# Patient Record
Sex: Male | Born: 1995 | Race: White | Hispanic: No | Marital: Single | Smoking: Never smoker
Health system: Southern US, Community
[De-identification: ages and names within clinical notes are randomized; demographics above are authoritative.]

## PROBLEM LIST (undated history)

## (undated) DIAGNOSIS — K219 Gastro-esophageal reflux disease without esophagitis: Secondary | ICD-10-CM

## (undated) DIAGNOSIS — K589 Irritable bowel syndrome without diarrhea: Secondary | ICD-10-CM

## (undated) HISTORY — DX: Gastro-esophageal reflux disease without esophagitis: K21.9

## (undated) HISTORY — PX: KNEE SURGERY: SHX244

## (undated) HISTORY — PX: TONSILLECTOMY: SUR1361

## (undated) HISTORY — DX: Irritable bowel syndrome, unspecified: K58.9

---
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
## 2016-07-01 ENCOUNTER — Encounter: Payer: Self-pay | Admitting: Family Medicine

## 2016-07-01 ENCOUNTER — Ambulatory Visit (INDEPENDENT_AMBULATORY_CARE_PROVIDER_SITE_OTHER): Payer: BLUE CROSS/BLUE SHIELD | Admitting: Family Medicine

## 2016-07-01 VITALS — BP 131/77 | HR 90 | Temp 98.1°F | Resp 20 | Wt 187.0 lb

## 2016-07-01 DIAGNOSIS — R609 Edema, unspecified: Secondary | ICD-10-CM | POA: Diagnosis not present

## 2016-07-01 LAB — CBC WITH DIFFERENTIAL/PLATELET
BASOS ABS: 0 {cells}/uL (ref 0–200)
Basophils Relative: 0 %
EOS ABS: 0 {cells}/uL — AB (ref 15–500)
Eosinophils Relative: 0 %
HCT: 47 % (ref 38.5–50.0)
Hemoglobin: 16.3 g/dL (ref 13.2–17.1)
LYMPHS PCT: 27 %
Lymphs Abs: 2052 cells/uL (ref 850–3900)
MCH: 31.4 pg (ref 27.0–33.0)
MCHC: 34.7 g/dL (ref 32.0–36.0)
MCV: 90.6 fL (ref 80.0–100.0)
MONOS PCT: 11 %
MPV: 9.8 fL (ref 7.5–12.5)
Monocytes Absolute: 836 cells/uL (ref 200–950)
NEUTROS PCT: 62 %
Neutro Abs: 4712 cells/uL (ref 1500–7800)
PLATELETS: 208 10*3/uL (ref 140–400)
RBC: 5.19 MIL/uL (ref 4.20–5.80)
RDW: 12.7 % (ref 11.0–15.0)
WBC: 7.6 10*3/uL (ref 3.8–10.8)

## 2016-07-01 MED ORDER — PREDNISONE 20 MG PO TABS: 40.0000 mg | ORAL_TABLET | Freq: Every day | ORAL | 0 refills | Status: DC

## 2016-07-01 MED ORDER — AMOXICILLIN-POT CLAVULANATE 875-125 MG PO TABS: 1.0000 | ORAL_TABLET | Freq: Two times a day (BID) | ORAL | 0 refills | Status: DC

## 2016-07-01 NOTE — Patient Instructions (Addendum)
Suck on sour patch candies or lemon drops. The tartness can help release parotid swelling (if that is the cause).   Antibiotics prescribed to cover infection.  Steroids prescribed to cover inflammation.  If worsening would need to be seen immediately, want to see you Friday regardless.

## 2016-07-01 NOTE — Progress Notes (Signed)
Allen BaptistSeth Trovato , Jun 30, 1995, 21 y.o., male MRN: [id number] Patient Care Team    Relationship Specialty Notifications Start End  Waldon MerlWilliam C Martin, PA-C PCP - General Physician Assistant  07/21/13     CC: Left-sided facial swelling Subjective: Pt presents for an acute OV with complaints of left-sided facial swelling of 3 days duration.  Patient is present with his mother today, they state that he has never experienced anything like this in the past. He reports about 3 days ago he noticed his face started to swell on the left side, and has been worsening over the last 3 days. He has created some discomfort with attempting to open his jaw fully. He denies fever, chills, tooth pain, ear pain, sore throat, difficulty swallowing, break in skin/redness over area or drainage. He has tried taking ibuprofen and using ice packs over the area. Nothing seems to be improving the area. Patient denies any other area of swelling or rash over his body. He is tolerating food and drink. Mother states he has been fully vaccinated with all his childhood immunizations. No flowsheet data found.  No Known Allergies Social History  Substance Use Topics  . Smoking status: Never Smoker  . Smokeless tobacco: Never Used  . Alcohol use No   Past Medical History:  Diagnosis Date  . GERD (gastroesophageal reflux disease)   . IBS (irritable bowel syndrome)    Past Surgical History:  Procedure Laterality Date  . KNEE SURGERY Left   . TONSILLECTOMY    . WISDOM TOOTH EXTRACTION Bilateral 2014   Family History  Problem Relation Age of Onset  . Heart disease Maternal Grandfather   . Hyperlipidemia Maternal Grandfather   . Diabetes Maternal Grandfather   . Heart disease Paternal Grandfather   . Hyperlipidemia Paternal Grandfather   . Diabetes Paternal Grandfather    Allergies as of 07/01/2016   No Known Allergies     Medication List    as of 07/01/2016  3:00 PM   You have not been prescribed any medications.       No results found for this or any previous visit (from the past 24 hour(s)). No results found.   ROS: Negative, with the exception of above mentioned in HPI   Objective:  BP 131/77 (BP Location: Right Arm, Patient Position: Sitting, Cuff Size: Large)   Pulse 90   Temp 98.1 F (36.7 C)   Resp 20   Wt 187 lb (84.8 kg)   SpO2 100%  There is no height or weight on file to calculate BMI. Gen: Afebrile. No acute distress. Nontoxic in appearance, well developed, well nourished. Pleasant Caucasian male. HENT: AT. No erythema, skin intact. Moderate swelling throughout left mandible extending to left temple, Focused over parotid gland. Bilateral TM visualized without erythema or bulging. MMM, no oral lesions. No teeth pain. No gum erythema, abscess or drainage. No drainage noted from parotid duct. Bilateral nares without erythema, drainage or swelling. Throat without erythema or exudates. No cough. No hoarseness. Mildly poor dental hygiene. Eyes:Pupils Equal Round Reactive to light, Extraocular movements intact,  Conjunctiva without redness, discharge or icterus. Neck/lymp/endocrine: Supple, no head or neck lymphadenopathy CV: RRR  Chest: CTAB, no wheeze or crackles. Good air movement, normal resp effort.  Abd: Soft. NTND. BS present  Neuro:  Normal gait. PERLA. EOMi. Alert. Oriented x3   Assessment/Plan: Allen Villa is a 21 y.o. male present for acute OV for  1. Parotid swelling - Rather significant left parotid swelling, consistent with parotitis.  Patient is afebrile. Other than obvious swelling, patient does not feel sick. He is only in mild discomfort, mostly with jaw opening. He is eating and drinking well. Discussed with patient and his mother today there are multiple causes to parotid gland swelling, which is what his exam is most consistent. Obtain labs to rule out infection, EBV or mumps. He can use NSAIDs for discomfort. Covered with antibiotic and steroid burst. - Mumps Antibody,  IgM - Mumps antibody, IgG - CBC w/Diff - Epstein-Barr virus VCA, IgM - amoxicillin-clavulanate (AUGMENTIN) 875-125 MG tablet; Take 1 tablet by mouth 2 (two) times daily.  Dispense: 20 tablet; Refill: 0 - predniSONE (DELTASONE) 20 MG tablet; Take 2 tablets (40 mg total) by mouth daily with breakfast.  Dispense: 10 tablet; Refill: 0 - External resources on parotitis, and home therapies to help with symptoms included. - Patient will follow-up in 2 days, if worsening he is to be seen immediately. If not improving on Friday would consider referral versus CT.   Reviewed expectations re: course of current medical issues.  Discussed self-management of symptoms.  Outlined signs and symptoms indicating need for more acute intervention.  Patient verbalized understanding and all questions were answered.  Patient received an After-Visit Summary.   electronically signed by:  Felix Pacini, DO  [facility] [place]

## 2016-07-02 ENCOUNTER — Encounter: Payer: Self-pay | Admitting: Family Medicine

## 2016-07-02 LAB — MUMPS ANTIBODY, IGM

## 2016-07-02 LAB — MUMPS ANTIBODY, IGG: Mumps IgG: 18.5 AU/mL — ABNORMAL HIGH (ref <9.00)

## 2016-07-02 LAB — EPSTEIN-BARR VIRUS VCA, IGM: EBV VCA IgM: <36 U/mL

## 2016-07-03 ENCOUNTER — Ambulatory Visit (INDEPENDENT_AMBULATORY_CARE_PROVIDER_SITE_OTHER): Payer: BLUE CROSS/BLUE SHIELD | Admitting: Family Medicine

## 2016-07-03 ENCOUNTER — Encounter: Payer: Self-pay | Admitting: Family Medicine

## 2016-07-03 VITALS — BP 116/71 | HR 52 | Temp 98.0°F | Resp 20 | Ht 73.0 in | Wt 186.8 lb

## 2016-07-03 DIAGNOSIS — K1120 Sialoadenitis, unspecified: Secondary | ICD-10-CM | POA: Diagnosis not present

## 2016-07-03 NOTE — Progress Notes (Signed)
Allen Villa , July 01, 1995, 21 y.o., male MRN: [id number] Patient Care Team    Relationship Specialty Notifications Start End  Waldon MerlWilliam C Martin, PA-C PCP - General Physician Assistant  07/21/13     CC: Left-sided facial swelling Subjective:  Parotiditis:  Pt presents today for follow up on parotiditis. He is tolerating the abx and steroid regimen. He has noted great improvement in his condition and the swelling has improved  a great deal. He is using heat and sugar free sour candies as well. He denies any fever or increased pain. Reviewed labs with him today: normal CBC, Mump titers and EBV titers.     Prior note:  Pt presents for an acute OV with complaints of left-sided facial swelling of 3 days duration.  Patient is present with his mother today, they state that he has never experienced anything like this in the past. He reports about 3 days ago he noticed his face started to swell on the left side, and has been worsening over the last 3 days. He has created some discomfort with attempting to open his jaw fully. He denies fever, chills, tooth pain, ear pain, sore throat, difficulty swallowing, break in skin/redness over area or drainage. He has tried taking ibuprofen and using ice packs over the area. Nothing seems to be improving the area. Patient denies any other area of swelling or rash over his body. He is tolerating food and drink. Mother states he has been fully vaccinated with all his childhood immunizations. No flowsheet data found.  No Known Allergies Social History  Substance Use Topics  . Smoking status: Never Smoker  . Smokeless tobacco: Never Used  . Alcohol use No   Past Medical History:  Diagnosis Date  . GERD (gastroesophageal reflux disease)   . IBS (irritable bowel syndrome)    Past Surgical History:  Procedure Laterality Date  . KNEE SURGERY Left   . TONSILLECTOMY    . WISDOM TOOTH EXTRACTION Bilateral 2014   Family History  Problem Relation Age of Onset    . Heart disease Maternal Grandfather   . Hyperlipidemia Maternal Grandfather   . Diabetes Maternal Grandfather   . Heart disease Paternal Grandfather   . Hyperlipidemia Paternal Grandfather   . Diabetes Paternal Grandfather    Allergies as of 07/03/2016   No Known Allergies     Medication List       Accurate as of 07/03/16  9:00 AM. Always use your most recent med list.          amoxicillin-clavulanate 875-125 MG tablet Commonly known as:  AUGMENTIN Take 1 tablet by mouth 2 (two) times daily.   predniSONE 20 MG tablet Commonly known as:  DELTASONE Take 2 tablets (40 mg total) by mouth daily with breakfast.       No results found for this or any previous visit (from the past 24 hour(s)). No results found.   ROS: Negative, with the exception of above mentioned in HPI   Objective:  BP 116/71 (BP Location: Right Arm, Patient Position: Sitting, Cuff Size: Large)   Pulse (!) 52   Temp 98 F (36.7 C)   Resp 20   Ht 6\' 1"  (1.854 m)   Wt 186 lb 12.8 oz (84.7 kg)   SpO2 100%   BMI 24.65 kg/m  Body mass index is 24.65 kg/m. Gen: Afebrile. No acute distress.  HENT: AT. Much improved left parotid swelling. Mild residual swelling remains.  MMM. No drainage.  Eyes:Pupils Equal Round Reactive  to light, Extraocular movements intact,  Conjunctiva without redness, discharge or icterus. Neck/lymp/endocrine: Supple,small left pea sized mass posterior mandible. Non-tender.    Assessment/Plan: Allen Villa is a 21 y.o. male present for follow up OV for  Parotitis - all labs normal, reviewed with him today. Much improved condition today.  - continue regimen of abx, steroid.  - encouraged routine dental hygiene and visit.  - F/U PRN  Reviewed expectations re: course of current medical issues.  Discussed self-management of symptoms.  Outlined signs and symptoms indicating need for more acute intervention.  Patient verbalized understanding and all questions were  answered.  Patient received an After-Visit Summary.  electronically signed by:  Felix Pacini, DO  [facility] [place]

## 2016-07-03 NOTE — Patient Instructions (Signed)
Continue current regimen. Much improvement today!!!  Follow with routine dental care.

## 2016-08-18 ENCOUNTER — Ambulatory Visit (INDEPENDENT_AMBULATORY_CARE_PROVIDER_SITE_OTHER): Payer: BLUE CROSS/BLUE SHIELD | Admitting: Family Medicine

## 2016-08-18 ENCOUNTER — Encounter: Payer: Self-pay | Admitting: Family Medicine

## 2016-08-18 VITALS — BP 113/71 | HR 56 | Temp 98.1°F | Resp 20 | Wt 173.5 lb

## 2016-08-18 DIAGNOSIS — K1120 Sialoadenitis, unspecified: Secondary | ICD-10-CM

## 2016-08-18 MED ORDER — PREDNISONE 50 MG PO TABS: 50.0000 mg | ORAL_TABLET | Freq: Every day | ORAL | 0 refills | Status: DC

## 2016-08-18 MED ORDER — AMOXICILLIN-POT CLAVULANATE 875-125 MG PO TABS: 1.0000 | ORAL_TABLET | Freq: Two times a day (BID) | ORAL | 0 refills | Status: DC

## 2016-08-18 NOTE — Progress Notes (Signed)
Jamarian Jacinto , 10-Nov-1995, 21 y.o., male MRN: [id number] Patient Care Team    Relationship Specialty Notifications Start End  Waldon Merl, PA-C PCP - General Physician Assistant  07/21/13     CC: Right-sided facial swelling Subjective:  Parotiditis: Pt presents today with Right sided facial swelling of 5 days duration. He reports he noticed a pain and swelling start, that was similar to the episode he experienced 1.5 months ago on the left side. He did not want it to get as bad as the other side had. 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 He denies fever, chills, sore throat, night sweats or teeth pain. He has been taking advil to help with swelling. He has not had a dental appointment yet, which was suggested last visit.  His labs last visit were negative for elevated WBC, EBV or Mumps IGM. He responded well to Augmentin and prednisone last occurrence.    Prior note: Pt presents today for follow up on parotiditis. He is tolerating the abx and steroid regimen. He has noted great improvement in his condition and the swelling has improved  a great deal. He is using heat and sugar free sour candies as well. He denies any fever or increased pain. Reviewed labs with him today: normal CBC, Mump titers and EBV titers.     Prior note:  Pt presents for an acute OV with complaints of left-sided facial swelling of 3 days duration.  Patient is present with his mother today, they state that he has never experienced anything like this in the past. He reports about 3 days ago he noticed his face started to swell on the left side, and has been worsening over the last 3 days. He has created some discomfort with attempting to open his jaw fully. He denies fever, chills, tooth pain, ear pain, sore throat, difficulty swallowing, break in skin/redness over area or drainage. He has tried taking ibuprofen and using ice packs over the area. Nothing  seems to be improving the area. Patient denies any other area of swelling or rash over his body. He is tolerating food and drink. Mother states he has been fully vaccinated with all his childhood immunizations. No flowsheet data found.  No Known Allergies Social History  Substance Use Topics  . Smoking status: Never Smoker  . Smokeless tobacco: Never Used  . Alcohol use No   Past Medical History:  Diagnosis Date  . GERD (gastroesophageal reflux disease)   . IBS (irritable bowel syndrome)    Past Surgical History:  Procedure Laterality Date  . KNEE SURGERY Left   . TONSILLECTOMY    . WISDOM TOOTH EXTRACTION Bilateral 2014   Family History  Problem Relation Age of Onset  . Heart disease Maternal Grandfather   . Hyperlipidemia Maternal Grandfather   . Diabetes Maternal Grandfather   . Heart disease Paternal Grandfather   . Hyperlipidemia Paternal Grandfather   . Diabetes Paternal Grandfather    Allergies as of 08/18/2016   No Known Allergies     Medication List    as of 08/18/2016  9:40 AM   You have not been prescribed any medications.     No results found for this or any previous visit (from the past 24 hour(s)). No results found.   ROS: Negative, with the exception of above mentioned in HPI   Objective:  BP 113/71 (BP Location: Right Arm, Patient Position: Sitting, Cuff Size:  Large)   Pulse (!) 56   Temp 98.1 F (36.7 C)   Resp 20   Wt 173 lb 8 oz (78.7 kg)   SpO2 100%   BMI 22.89 kg/m  Body mass index is 22.89 kg/m. Gen: Afebrile. No acute distress.  HENT: AT. Bilateral TM visualized and normal in appearance. MMM. Bilateral nares wnl. Throat without erythema or exudates. No teeth pain . Moderately poor dental care. Mild swelling right parotid.  Eyes:Pupils Equal Round Reactive to light, Extraocular movements intact,  Conjunctiva without redness, discharge or icterus. Neck/lymp/endocrine: Supple,no lymphadenopathy Skin: no rashes, purpura or petechiae.    Neuro:  Normal gait. PERLA. EOMi. Alert. Oriented.   Assessment/Plan: Duan Scharnhorst is a 21 y.o. male present for follow up OV for  Parotitis, right - now with right sided parotitis. Very mild in comprasion to left side last month. Discussed the importance of dental cleanings and dental health. Labs were normal last visit for EBV, CBC, Mumps, no need to repeat  Today.  - Augmentin and steroid burst.  - encouraged routine dental hygiene and visit again today.  - F/U PRN  Reviewed expectations re: course of current medical issues.  Discussed self-management of symptoms.  Outlined signs and symptoms indicating need for more acute intervention.  Patient verbalized understanding and all questions were answered.  Patient received an After-Visit Summary.  electronically signed by:  Felix Pacini, DO  [facility] - [place]

## 2016-08-18 NOTE — Patient Instructions (Signed)
Start antibiotic and steroid again.  Have a dental check up ASAP.  Good luck on your show!!!    Parotitis Parotitis means that you have irritation and swelling (inflammation) in one or both of your parotid glands. These glands make spit (saliva). They are found on each side of your face, below and in front of your earlobes. You may or may not have pain with this condition. Follow these instructions at home: Medicines   Take over-the-counter and prescription medicines only as told by your doctor.  If you were prescribed an antibiotic medicine, take it as told by your doctor. Do not stop taking the antibiotic even if you start to feel better. Managing pain and swelling   Apply warm cloths (compresses) to the swollen area as told by your doctor.  Gently rub your parotid glands as told by your doctor. General instructions    Drink enough fluid to keep your pee (urine) clear or pale yellow.  Suck on sour candy. This may help:  To make your mouth less dry.  To make more spit.  Keep your mouth clean and moist.  Gargle with a salt-water mixture 3-4 times per day, or as needed.  To make a salt-water mixture, stir -1 tsp of salt into 1 cup of warm water.  Take good care of your mouth:  Brush your teeth at least two times per day.  Floss your teeth every day.  See your dentist regularly.  Do not use tobacco products. These include cigarettes, chewing tobacco, or e-cigarettes. If you need help quitting,  ask your doctor.  Keep all follow-up visits as told by your doctor. This is important. Contact a doctor if:  You have a fever or chills.  You have new symptoms.  Your symptoms get worse.  Your symptoms do not get better with treatment. This information is not intended to replace advice given to you by your health care provider. Make sure you discuss any questions you have with your health care provider. Document Released: 05/16/2010 Document Revised: 09/19/2015  Document Reviewed: 09/06/2014 Elsevier Interactive Patient Education  2017 [employer].

## 2016-11-09 ENCOUNTER — Ambulatory Visit (INDEPENDENT_AMBULATORY_CARE_PROVIDER_SITE_OTHER): Payer: BLUE CROSS/BLUE SHIELD | Admitting: Family Medicine

## 2016-11-09 ENCOUNTER — Encounter: Payer: Self-pay | Admitting: Family Medicine

## 2016-11-09 VITALS — BP 135/72 | HR 99 | Temp 98.0°F | Resp 20 | Wt 182.0 lb

## 2016-11-09 DIAGNOSIS — R1909 Other intra-abdominal and pelvic swelling, mass and lump: Secondary | ICD-10-CM | POA: Diagnosis not present

## 2016-11-09 DIAGNOSIS — R109 Unspecified abdominal pain: Secondary | ICD-10-CM | POA: Diagnosis not present

## 2016-11-09 DIAGNOSIS — R1032 Left lower quadrant pain: Secondary | ICD-10-CM | POA: Diagnosis not present

## 2016-11-09 LAB — BASIC METABOLIC PANEL
BUN: 27 mg/dL — AB (ref 7–25)
CHLORIDE: 100 mmol/L (ref 98–110)
CO2: 25 mmol/L (ref 20–31)
Calcium: 9.5 mg/dL (ref 8.6–10.3)
Creat: 1.31 mg/dL (ref 0.60–1.35)
Glucose, Bld: 94 mg/dL (ref 65–99)
POTASSIUM: 4.4 mmol/L (ref 3.5–5.3)
SODIUM: 135 mmol/L (ref 135–146)

## 2016-11-09 NOTE — Progress Notes (Signed)
Allen Villa Whipp , February 15, 1996, 21 y.o., male MRN: [id number] Patient Care Team    Relationship Specialty Notifications Start End  Natalia LeatherwoodKuneff, Kyshon Tolliver A, DO PCP - General Family Medicine  08/18/16     Chief Complaint  Patient presents with  . Groin Pain    bulge left side     Subjective: Pt presents for an OV with complaints of left inguinal groin pain/swelling which started of 4 weeks ago.  Patient reports a mass of his left inguinal area that is reducible, and worse in the evenings and when standing. He is a weight lifter. He denies any known injury that created the issue originally. He states he noticed a mass, and it was sore, it will be present for a couple of days and then go away. He reports he is able to reduce it. He was on vacation last week and picked up a suitcase, and it has been uncomfortable since. He denies any scrotal pain/swelling Or Bowel changes. He denies painful urination or ejaculation. He feels like the mass is becoming larger over the last 4 weeks. He denies fever, chills, nausea or vomit.  No flowsheet data found.  No Known Allergies Social History  Substance Use Topics  . Smoking status: Never Smoker  . Smokeless tobacco: Never Used  . Alcohol use No   Past Medical History:  Diagnosis Date  . GERD (gastroesophageal reflux disease)   . IBS (irritable bowel syndrome)    Past Surgical History:  Procedure Laterality Date  . KNEE SURGERY Left   . TONSILLECTOMY    . WISDOM TOOTH EXTRACTION Bilateral 2014   Family History  Problem Relation Age of Onset  . Heart disease Maternal Grandfather   . Hyperlipidemia Maternal Grandfather   . Diabetes Maternal Grandfather   . Heart disease Paternal Grandfather   . Hyperlipidemia Paternal Grandfather   . Diabetes Paternal Grandfather    Allergies as of 11/09/2016   No Known Allergies     Medication List    as of 11/09/2016  3:37 PM   You have not been prescribed any medications.     All past medical history,  surgical history, allergies, family history, immunizations andmedications were updated in the EMR today and reviewed under the history and medication portions of their EMR.     ROS: Negative, with the exception of above mentioned in HPI   Objective:  BP 135/72 (BP Location: Right Arm, Patient Position: Sitting, Cuff Size: Large)   Pulse 99   Temp 98 F (36.7 C)   Resp 20   Wt 182 lb (82.6 kg)   SpO2 97%   BMI 24.01 kg/m  Body mass index is 24.01 kg/m. Gen: Afebrile. No acute distress. Nontoxic in appearance, well developed, well nourished. Pleasant Caucasian male. HENT: AT. [place]. MMM, no oral lesions.  Eyes:Pupils Equal Round Reactive to light, Extraocular movements intact,  Conjunctiva without redness, discharge or icterus. Abd: Soft. Flat. ND. BS present.  No rebound or guarding. Very mild tenderness to palpation left inguinal/suprapubic area. Standing, small mass palpable. No scrotal swelling or pain. Neuro:  Normal gait. PERLA. EOMi. Alert. Oriented x3  No exam data present No results found. No results found for this or any previous visit (from the past 24 hour(s)).  Assessment/Plan: Allen Villa Zukowski is a 21 y.o. male present for OV for  Abdominal pain, unspecified abdominal location - Basic Metabolic Panel (BMET) Left inguinal pain/Mass of left inguinal region - Small defect palpable when standing, patient states it actually doesn't  appear to be as large today as prior. Given recurrence, and patient stating that it is becoming larger and more frequently painful order ultrasound imaging of the pelvis. - If needed will refer to general surgery, patient would like to have it addressed. - US Pelvis Limited; Future - [place] Art/Ven Flow Abd Pelv Doppler; Future - Discussed emergent symptoms with patient today. NSAIDs for discomfort. - Follow-up depending upon image study.   Reviewed expectations re: course of current medical issues.  Discussed self-management of symptoms.  Outlined  signs and symptoms indicating need for more acute intervention.  Patient verbalized understanding and all questions were answered.  Patient received an After-Visit Summary.    No orders of the defined types were placed in this encounter.    Note is dictated utilizing voice recognition software. Although note has been proof read prior to signing, occasional typographical errors still can be missed. If any questions arise, please do not hesitate to call for verification.   electronically signed by:  Felix Pacini, DO  [facility] - [place]

## 2016-11-09 NOTE — Patient Instructions (Signed)
Imaging studies ordered today. They will call you with instructions and directions.  Inguinal Hernia, Adult An inguinal hernia is when fat or the intestines push through the area where the leg meets the lower belly (groin) and make a rounded lump (bulge). This condition happens over time. There are three types of inguinal hernias. These types include:  Hernias that can be pushed back into the belly (are reducible).  Hernias that cannot be pushed back into the belly (are incarcerated).  Hernias that cannot be pushed back into the belly and lose their blood supply (get strangulated). This type needs emergency surgery.  Follow these instructions at home: Lifestyle  Drink enough fluid to keep your urine (pee) clear or pale yellow.  Eat plenty of fruits, vegetables, and whole grains. These have a lot of fiber. Talk with your doctor if you have questions.  Avoid lifting heavy objects.  Avoid standing for long periods of time.  Do not use tobacco products. These include cigarettes, chewing tobacco, or e-cigarettes. If you need help quitting, ask your doctor.  Try to stay at a healthy weight. General instructions  Do not try to force the hernia back in.  Watch your hernia for any changes in color or size. Let your doctor know if there are any changes.  Take over-the-counter and prescription medicines only as told by your doctor.  Keep all follow-up visits as told by your doctor. This is important. Contact a doctor if:  You have a fever.  You have new symptoms.  Your symptoms get worse. Get help right away if:  The area where the legs meets the lower belly has: ? Pain that gets worse suddenly. ? A bulge that gets bigger suddenly and does not go down. ? A bulge that turns red or purple. ? A bulge that is painful to the touch.  You are a man and your scrotum: ? Suddenly feels painful. ? Suddenly changes in size.  You feel sick to your stomach (nauseous) and this feeling does  not go away.  You throw up (vomit) and this keeps happening.  You feel your heart beating a lot more quickly than normal.  You cannot poop (have a bowel movement) or pass gas. This information is not intended to replace advice given to you by your health care provider. Make sure you discuss any questions you have with your health care provider. Document Released: 05/14/2006 Document Revised: 09/19/2015 Document Reviewed: 02/21/2014 Elsevier Interactive Patient Education  2018 [employer]

## 2016-11-10 ENCOUNTER — Other Ambulatory Visit: Payer: Self-pay | Admitting: Family Medicine

## 2016-11-10 DIAGNOSIS — R1032 Left lower quadrant pain: Secondary | ICD-10-CM

## 2016-11-12 ENCOUNTER — Telehealth: Payer: Self-pay | Admitting: Family Medicine

## 2016-11-12 NOTE — Telephone Encounter (Signed)
Spoke with patient reviewed information . Patient states he was just trying to speed things up explained we needed US completed in order to determine appropriate referral. Patient verbalized understanding.

## 2016-11-12 NOTE — Telephone Encounter (Signed)
Patient called asking for a referral for a surgeon. Did not share details.

## 2016-11-12 NOTE — Telephone Encounter (Signed)
I am uncertain why he is asking for a referral PRIOR to completing the image study. We discussed during his appt a Referral will be placed for him, after US results received if evidence of hernia. Their typically needs to be some type of proof of condition prior to acceptance of a referral. Otherwise once they get to the surgeon they have to complete imaging and then follow up with them again. This will save him steps and ensure he is referred appropriately.

## 2016-11-13 ENCOUNTER — Ambulatory Visit
Admission: RE | Admit: 2016-11-13 | Discharge: 2016-11-13 | Disposition: A | Discharge status: Home / Self Care | Payer: BLUE CROSS/BLUE SHIELD | Source: Ambulatory Visit | Attending: Family Medicine | Admitting: Family Medicine

## 2016-11-13 ENCOUNTER — Encounter: Payer: Self-pay | Admitting: *Deleted

## 2016-11-13 ENCOUNTER — Telehealth: Payer: Self-pay | Admitting: Family Medicine

## 2016-11-13 DIAGNOSIS — R1032 Left lower quadrant pain: Secondary | ICD-10-CM

## 2016-11-13 DIAGNOSIS — K4090 Unilateral inguinal hernia, without obstruction or gangrene, not specified as recurrent: Secondary | ICD-10-CM

## 2016-11-13 NOTE — Telephone Encounter (Signed)
Please call pt: - his US did show evidence of a left hernia.  - I have placed a referral to surgery. If he has increased pain, unable to have bowel movements, or skin color changes over the hernia (red/purple), fever--> go to ED this an emergency. Otherwise the surgeons office will be getting a hold of him to schedule.  - no lifting.

## 2016-11-13 NOTE — Telephone Encounter (Signed)
Left detailed message with results and instructions on [facility] mail [facility]

## 2018-05-09 IMAGING — US US SCROTUM
1 series · 14 of 25 positions shown · non-contrast
Comparison: None.

CLINICAL DATA: Palpable left groin mass.

EXAM:
SCROTAL ULTRASOUND
DOPPLER ULTRASOUND OF THE TESTICLES
TECHNIQUE: Complete ultrasound examination of the testicles, epididymis, and
other scrotal structures was performed. Color and spectral Doppler
ultrasound were also utilized to evaluate blood flow to the
testicles.

[Series 1: us scrotum · 0.07mm/px · 14 of 86 slices shown]
[im 1/86]
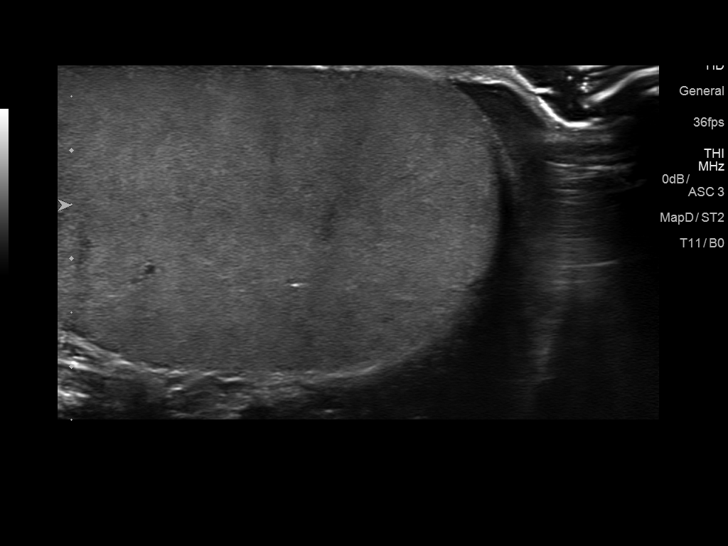
[im 8/86]
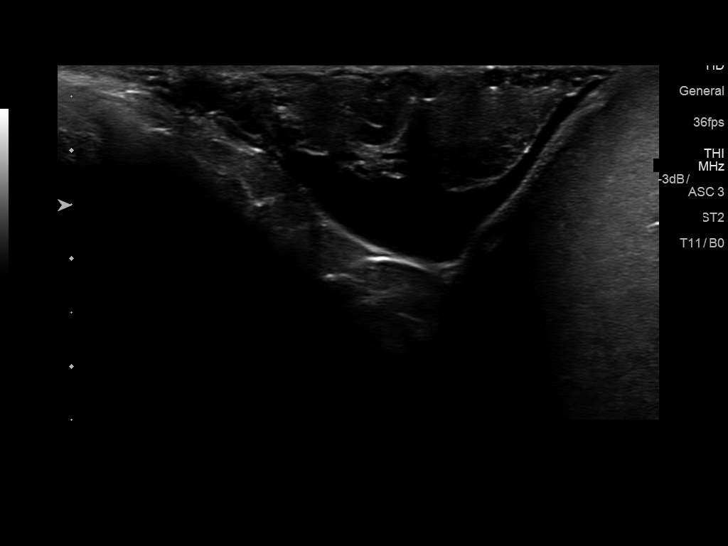
[im 15/86]
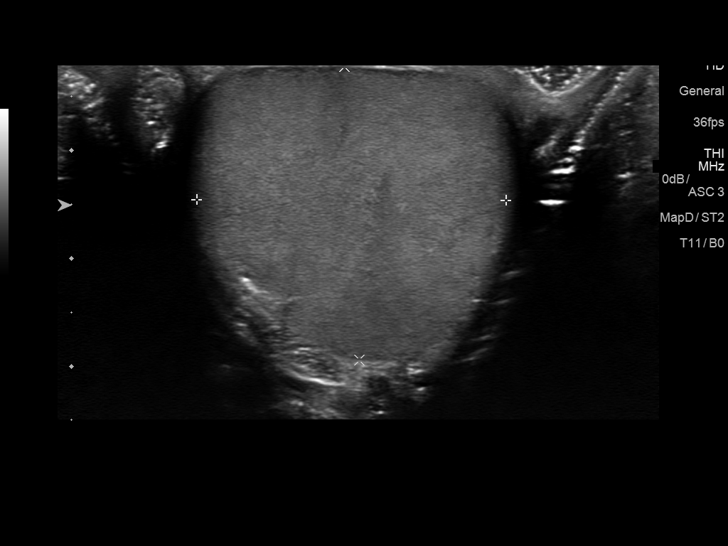
[im 22/86]
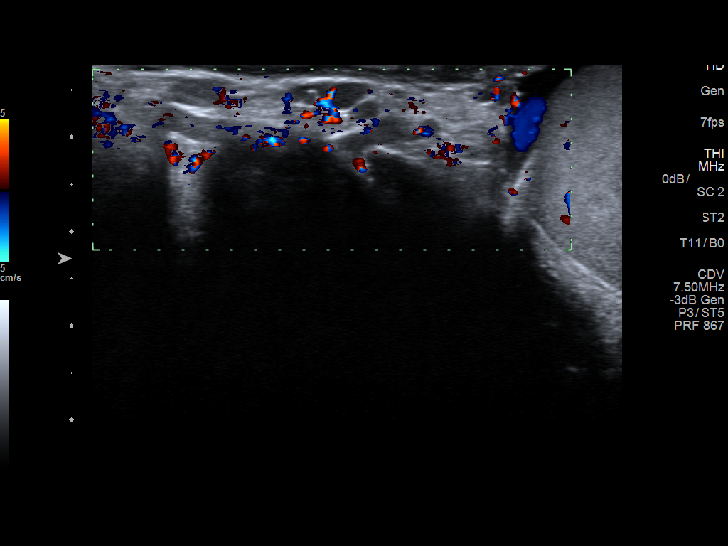
[im 29/86]
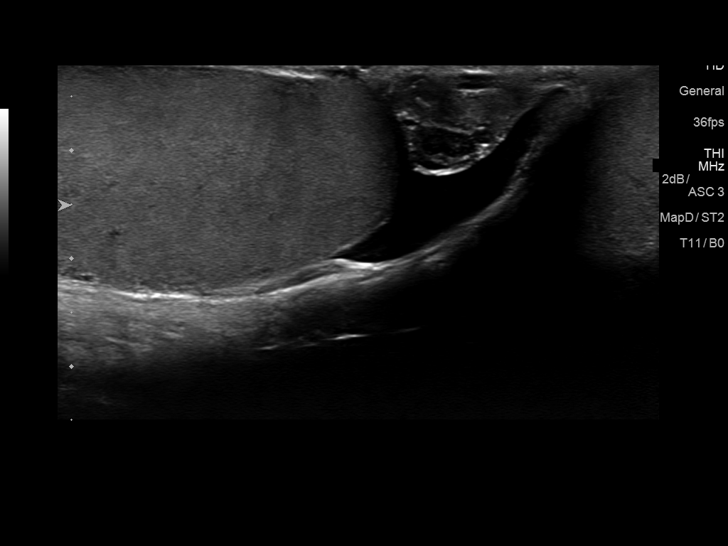
[im 32/86]
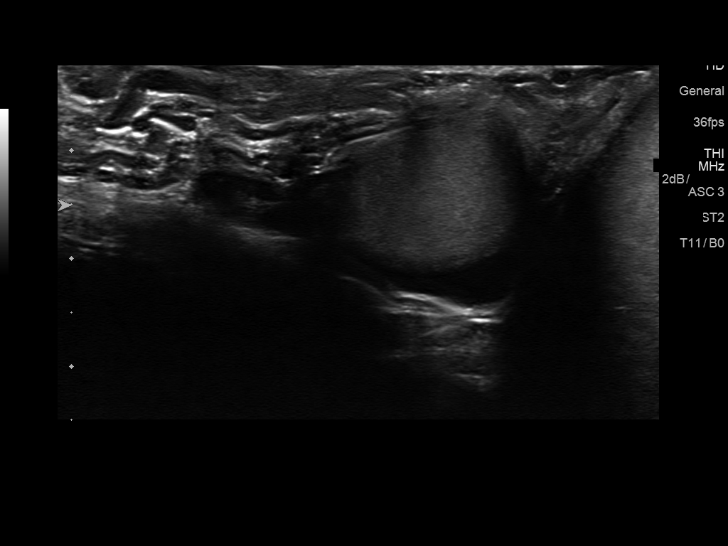
[im 39/86]
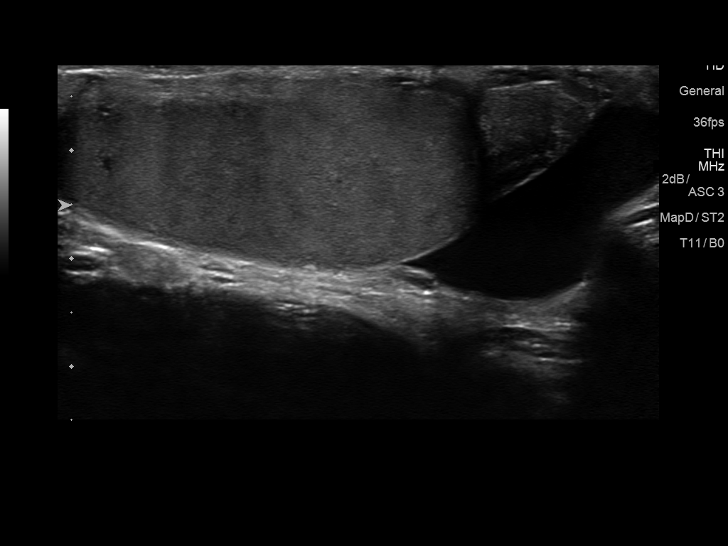
[im 47/86]
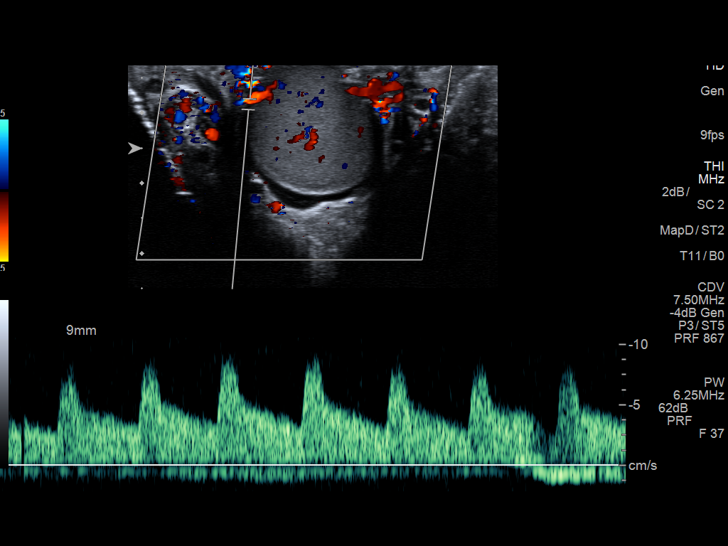
[im 54/86]
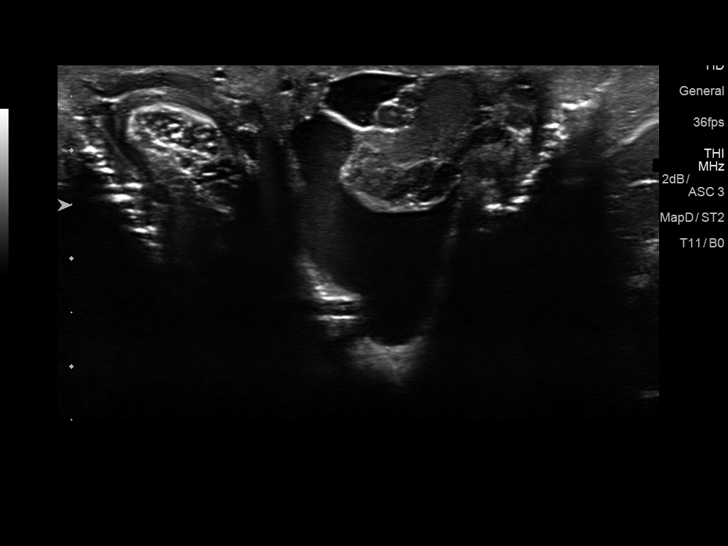
[im 57/86]
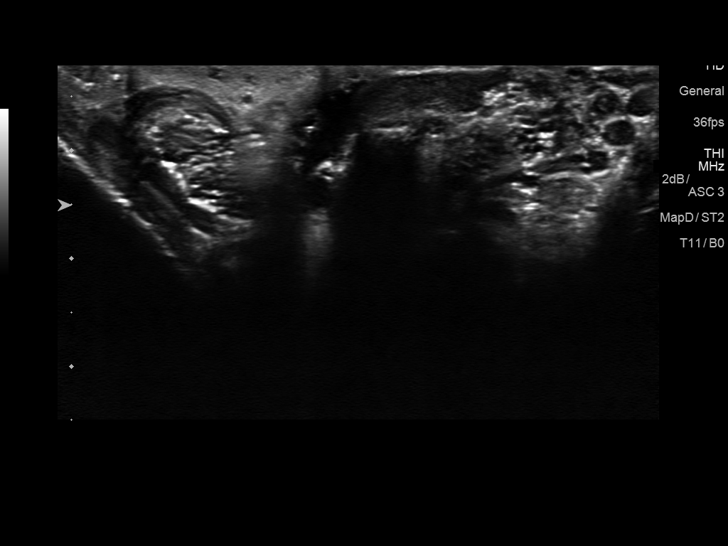
[im 64/86]
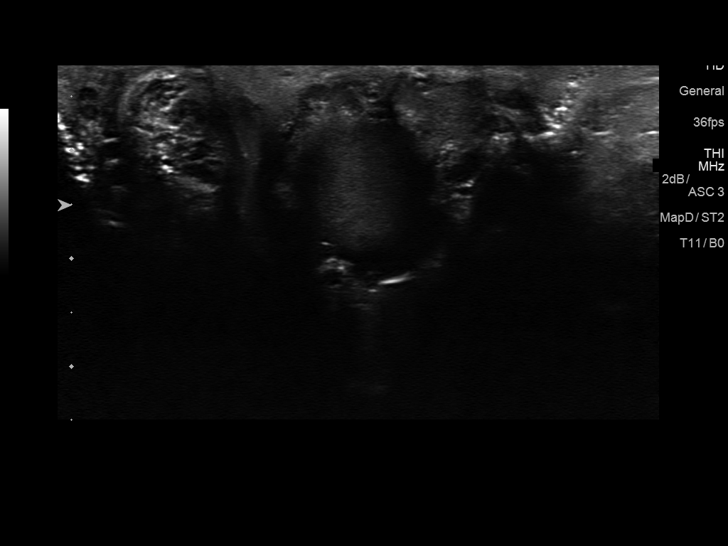
[im 71/86]
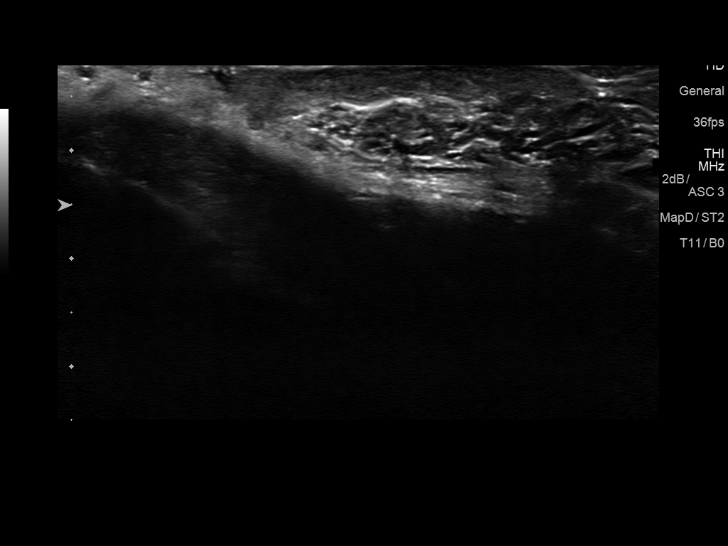
[im 78/86]
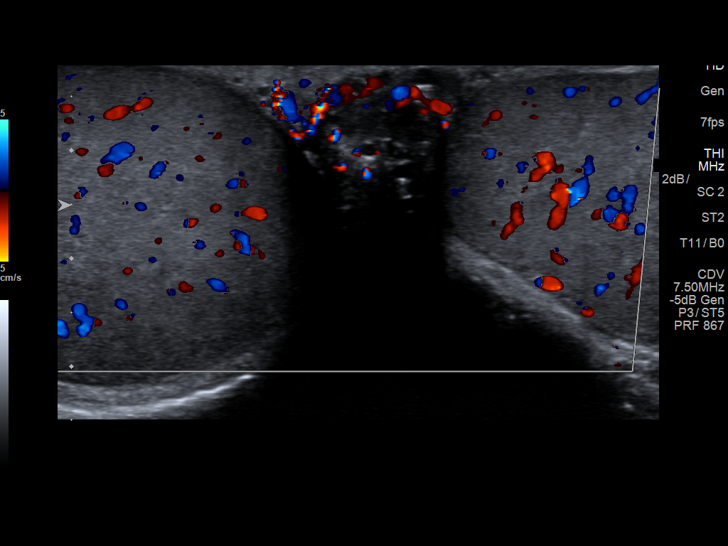
[im 86/86]
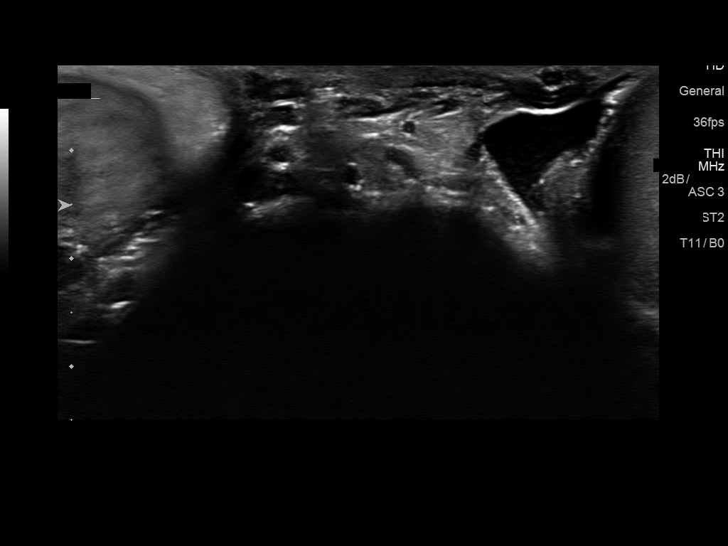

[14 of 25 positions shown; findings below may reference images not displayed]

FINDINGS: Right testicle

Measurements: 5.4 x 2.7 x 2.9 cm. No mass or microlithiasis
visualized.

Left testicle

Measurements: 4.6 x 2.3 x 2.8 cm. No mass or microlithiasis
visualized.

Right epididymis: Normal in size and appearance. Incidental note of
5 mm cysts.

Left epididymis:  Normal in size and appearance.

Hydrocele:  Small left-sided hydrocele.

Varicocele:  None visualized.

Indirect left inguinal hernia with bowel within the inguinal canal.
The hernia does not extend to the left scrotal sac.

Pulsed Doppler interrogation of both testes demonstrates normal low
resistance arterial and venous waveforms bilaterally.
IMPRESSION: Normal appearance of the testicles.

Small left hydrocele.

Bowel containing left indirect inguinal hernia.
